# Patient Record
Sex: Female | Born: 1985 | Race: Black or African American | Hispanic: No | Marital: Married
Health system: Southern US, Community
[De-identification: ages and names within clinical notes are randomized; demographics above are authoritative.]

## PROBLEM LIST (undated history)

## (undated) DIAGNOSIS — D649 Anemia, unspecified: Secondary | ICD-10-CM

## (undated) DIAGNOSIS — J45909 Unspecified asthma, uncomplicated: Secondary | ICD-10-CM

## (undated) HISTORY — PX: ABDOMINAL HYSTERECTOMY: SHX81

---
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
## 2006-02-05 ENCOUNTER — Emergency Department (HOSPITAL_COMMUNITY): Admission: EM | Admit: 2006-02-05 | Discharge: 2006-02-06 | Payer: Self-pay | Admitting: Emergency Medicine

## 2006-02-06 ENCOUNTER — Emergency Department (HOSPITAL_COMMUNITY): Admission: EM | Admit: 2006-02-06 | Discharge: 2006-02-06 | Payer: Self-pay | Admitting: *Deleted

## 2018-05-06 ENCOUNTER — Encounter: Payer: Medicaid Other | Admitting: Physician Assistant

## 2018-05-06 ENCOUNTER — Telehealth: Payer: Self-pay | Admitting: Physician Assistant

## 2018-05-06 ENCOUNTER — Encounter: Payer: Self-pay | Admitting: Physician Assistant

## 2018-05-06 MED ORDER — LAMOTRIGINE 25 MG PO TABS: ORAL_TABLET | ORAL | 1 refills | Status: DC

## 2018-05-06 MED ORDER — AMPHETAMINE-DEXTROAMPHET ER 20 MG PO CP24: 20.0000 mg | ORAL_CAPSULE | Freq: Two times a day (BID) | ORAL | 0 refills | Status: DC

## 2018-05-06 NOTE — Progress Notes (Deleted)
[facility]  Patient ID: Angela Stafford,  MRN: [id number]  PCP: Patient, No Pcp Per  Date of Evaluation: 05/06/2018 Time spent:45 minutes  Chief Complaint:  Chief Complaint    Depression; ADD      HISTORY/CURRENT STATUS: HPI   Patient reports loss of interest in usual activities and is unable to enjoy things. Going on for 4 months.  Ran out of her meds from appt in April and didn't come back. Has decreased energy or motivation.  Appetite has not changed.  More extreme sadness, tearfulness, or feelings of hopelessness.  Has changes in concentration since she hasn't been on Adderall, and trouble remembering things.  Denies suicidal or homicidal thoughts. Back in June or July, had a really rough time, couldn't even go in to work b/c depression.  Patient randomly has increased energy but no decreased need for sleep, no increased talkativeness, no racing thoughts, but can be impulsive when she feels really good and will buy a lot for projects or cooking and then may start it but never finishes things.  Pos increased libido when she has the impulsivity.  Also grandiosity during those times.  They can last for a few days or a few weeks and then she crashes.  Then the depression recurs and can last a month or more.   States that attention is much worse since not on the Adderall  Not able to focus on things and finish tasks to completion.   Anxiety is a problem sometimes. Much worse in crowds, like grocery store, and she'll even avoid going to a store till late at night so it won't be so crowded.  Will get panicky with SOB and palpitations for a few minutes when she feels 'cornered.'  Individual Medical History/ Review of Systems: Changes? :No   Past medications for mental health diagnoses include:  Lexapro, Lithium, Adderall, Zoloft   Allergies: Patient has no known allergies.  Current Medications:  Current Outpatient Medications:  .  Fluticasone-Salmeterol (ADVAIR) 500-50  MCG/DOSE AEPB, Inhale 1 puff into the lungs 2 (two) times daily., Disp: , Rfl:  .  gabapentin (NEURONTIN) 300 MG capsule, Take 300 mg by mouth 3 (three) times daily., Disp: , Rfl:  .  meloxicam (MOBIC) 15 MG tablet, Take 15 mg by mouth daily., Disp: , Rfl:  .  montelukast (SINGULAIR) 10 MG tablet, Take 10 mg by mouth at bedtime., Disp: , Rfl:    Medication Side Effects: none  Family Medical/ Social History: Changes? No  MENTAL HEALTH EXAM:  There were no vitals taken for this visit.There is no height or weight on file to calculate BMI.  General Appearance: Well Groomed  Eye Contact:  Good  Speech:  Clear and Coherent  Volume:  Normal  Mood:  Depressed  Affect:  Depressed  Thought Process:  Goal Directed  Orientation:  Full (Time, Place, and Person)  Thought Content: Logical   Suicidal Thoughts:  No  Homicidal Thoughts:  No  Memory:  WNL  Judgement:  Good  Insight:  Good  Psychomotor Activity:  Normal  Concentration:  Concentration: Fair  Recall:  Good  Fund of Knowledge: Good  Language: Good  Assets:  Desire for Improvement  ADL's:  Intact  Cognition: WNL  Prognosis:  Good    DIAGNOSES: No diagnosis found.  Receiving Psychotherapy: No    RECOMMENDATIONS: I spent 45 minutes with patient discussing her dx and tx options.    Melony Overly, PA-C

## 2018-05-08 ENCOUNTER — Encounter: Payer: Self-pay | Admitting: Physician Assistant

## 2018-05-08 NOTE — Progress Notes (Signed)
This encounter was created in error - please disregard.

## 2018-05-13 NOTE — Telephone Encounter (Signed)
No notes.  Encounter was opened in error.

## 2020-06-13 ENCOUNTER — Encounter (HOSPITAL_BASED_OUTPATIENT_CLINIC_OR_DEPARTMENT_OTHER): Payer: Self-pay | Admitting: *Deleted

## 2020-06-13 ENCOUNTER — Other Ambulatory Visit: Payer: Self-pay

## 2020-06-13 DIAGNOSIS — N898 Other specified noninflammatory disorders of vagina: Secondary | ICD-10-CM | POA: Diagnosis not present

## 2020-06-13 DIAGNOSIS — Z202 Contact with and (suspected) exposure to infections with a predominantly sexual mode of transmission: Secondary | ICD-10-CM | POA: Diagnosis not present

## 2020-06-13 DIAGNOSIS — M545 Low back pain, unspecified: Secondary | ICD-10-CM | POA: Diagnosis not present

## 2020-06-13 NOTE — ED Triage Notes (Signed)
C/o vaginal discharge , back pain and lower pelvic pain x 3 days

## 2020-06-14 ENCOUNTER — Emergency Department (HOSPITAL_BASED_OUTPATIENT_CLINIC_OR_DEPARTMENT_OTHER)
Admission: EM | Admit: 2020-06-14 | Discharge: 2020-06-14 | Disposition: A | Discharge status: Home / Self Care | Payer: Medicaid Other | Attending: Emergency Medicine | Admitting: Emergency Medicine

## 2020-06-14 DIAGNOSIS — Z202 Contact with and (suspected) exposure to infections with a predominantly sexual mode of transmission: Secondary | ICD-10-CM

## 2020-06-14 DIAGNOSIS — N898 Other specified noninflammatory disorders of vagina: Secondary | ICD-10-CM

## 2020-06-14 HISTORY — DX: Anemia, unspecified: D64.9

## 2020-06-14 LAB — URINALYSIS, ROUTINE W REFLEX MICROSCOPIC
Bilirubin Urine: NEGATIVE
Glucose, UA: NEGATIVE mg/dL
Hgb urine dipstick: NEGATIVE
Ketones, ur: NEGATIVE mg/dL
Leukocytes,Ua: NEGATIVE
Nitrite: NEGATIVE
Protein, ur: NEGATIVE mg/dL
Specific Gravity, Urine: 1.02 (ref 1.005–1.030)
pH: 6 (ref 5.0–8.0)

## 2020-06-14 MED ORDER — AZITHROMYCIN 250 MG PO TABS
1000.0000 mg | ORAL_TABLET | Freq: Once | ORAL | Status: AC
  Administered 2020-06-14: 1000 mg via ORAL
  Filled 2020-06-14: qty 4

## 2020-06-14 MED ORDER — CEFTRIAXONE SODIUM 500 MG IJ SOLR
500.0000 mg | Freq: Once | INTRAMUSCULAR | Status: AC
  Administered 2020-06-14: 500 mg via INTRAMUSCULAR
  Filled 2020-06-14: qty 500

## 2020-06-14 NOTE — Discharge Instructions (Addendum)
Your were treated for STD due to your concern for potential exposure.  Your urine looks normal.  You have been treated for STD while in the ED.  I recommend abstinence for 7 days and have any of your partners be treated/tested as well.  If your symptoms change or worsen, please return to the ER.

## 2020-06-14 NOTE — ED Provider Notes (Signed)
[facility] EMERGENCY DEPARTMENT Provider Note   CSN: [id number] Arrival date & time: 06/13/20  2350     History Chief Complaint  Patient presents with  . Vaginal Discharge    Angela Stafford is a 34 y.o. female.  Patient presents to the emergency department with a chief complaint of vaginal discharge. She also reports some slight back pain. She states she has been having the symptoms for the past 3 days. She states that she is concerned for STD. She reports that she has a promiscuous partner. She denies any fevers or chills. Denies any vomiting. Denies any dysuria. Hx of hysterectomy.   The history is provided by the patient. No language interpreter was used.       Past Medical History:  Diagnosis Date  . Anemia     There are no problems to display for this patient.   Past Surgical History:  Procedure Laterality Date  . ABDOMINAL HYSTERECTOMY    . CESAREAN SECTION       OB History   No obstetric history on file.     No family history on file.  Social History   Tobacco Use  . Smoking status: Unknown If Ever Smoked  . Smokeless tobacco: Never Used  Substance Use Topics  . Alcohol use: Not Currently    Comment: occasionally  . Drug use: Not Currently    Home Medications Prior to Admission medications   Not on File    Allergies    Patient has no known allergies.  Review of Systems   Review of Systems  All other systems reviewed and are negative.   Physical Exam Updated Vital Signs BP (!) 114/49   Pulse 79   Temp 98.3 F (36.8 C) (Oral)   Resp 16   Ht 5\' 1"  (1.549 m)   Wt 53.5 kg   SpO2 100%   BMI 22.30 kg/m   Physical Exam Vitals and nursing note reviewed.  Constitutional:      General: She is not in acute distress.    Appearance: She is well-developed.  HENT:     Head: Normocephalic and atraumatic.  Eyes:     Conjunctiva/sclera: Conjunctivae normal.  Cardiovascular:     Rate and Rhythm: Normal rate.     Heart sounds:  No murmur heard.   Pulmonary:     Effort: Pulmonary effort is normal. No respiratory distress.  Abdominal:     General: There is no distension.     Comments: No focal abdominal tenderness, no RLQ tenderness or pain at McBurney's point, no RUQ tenderness or Murphy's sign, no left-sided abdominal tenderness, no fluid wave, or signs of peritonitis   Musculoskeletal:     Cervical back: Neck supple.     Comments: Moves all extremities  Skin:    General: Skin is warm and dry.  Neurological:     Mental Status: She is alert and oriented to person, place, and time.  Psychiatric:        Mood and Affect: Mood normal.        Behavior: Behavior normal.     ED Results / Procedures / Treatments   Labs (all labs ordered are listed, but only abnormal results are displayed) Labs Reviewed  URINALYSIS, ROUTINE W REFLEX MICROSCOPIC  PREGNANCY, URINE    EKG None  Radiology No results found.  Procedures Procedures (including critical care time)  Medications Ordered in ED Medications  azithromycin (ZITHROMAX) tablet 1,000 mg (has no administration in time range)  cefTRIAXone (ROCEPHIN)  injection 500 mg (has no administration in time range)    ED Course  I have reviewed the triage vital signs and the nursing notes.  Pertinent labs & imaging results that were available during my care of the patient were reviewed by me and considered in my medical decision making (see chart for details).    MDM Rules/Calculators/A&P                          Patient with vaginal discharge. She has concern for STD. She would like treatment for this. She declines pelvic exam. Her abdomen is soft and nontender. I doubt appendicitis, doubt kidney stone. Urinalysis reassuring. She has history of hysterectomy. Patient given Rocephin and azithromycin for STD. Final Clinical Impression(s) / ED Diagnoses Final diagnoses:  Vaginal discharge  Potential exposure to STD    Rx / DC Orders ED Discharge Orders     None       Roxy Horseman, PA-C 06/14/20 0201    Wilkie Aye Mayer Masker, MD 06/14/20 [phone]

## 2021-06-29 ENCOUNTER — Other Ambulatory Visit: Payer: Self-pay

## 2021-06-29 ENCOUNTER — Emergency Department (HOSPITAL_BASED_OUTPATIENT_CLINIC_OR_DEPARTMENT_OTHER): Payer: 59

## 2021-06-29 ENCOUNTER — Emergency Department (HOSPITAL_BASED_OUTPATIENT_CLINIC_OR_DEPARTMENT_OTHER)
Admission: EM | Admit: 2021-06-29 | Discharge: 2021-06-29 | Disposition: A | Discharge status: Home / Self Care | Payer: 59 | Attending: Emergency Medicine | Admitting: Emergency Medicine

## 2021-06-29 ENCOUNTER — Encounter (HOSPITAL_BASED_OUTPATIENT_CLINIC_OR_DEPARTMENT_OTHER): Payer: Self-pay | Admitting: *Deleted

## 2021-06-29 DIAGNOSIS — R0602 Shortness of breath: Secondary | ICD-10-CM | POA: Diagnosis present

## 2021-06-29 DIAGNOSIS — Z20822 Contact with and (suspected) exposure to covid-19: Secondary | ICD-10-CM | POA: Diagnosis not present

## 2021-06-29 DIAGNOSIS — J45901 Unspecified asthma with (acute) exacerbation: Secondary | ICD-10-CM | POA: Diagnosis not present

## 2021-06-29 HISTORY — DX: Unspecified asthma, uncomplicated: J45.909

## 2021-06-29 LAB — COMPREHENSIVE METABOLIC PANEL
ALT: 14 U/L (ref 0–44)
AST: 18 U/L (ref 15–41)
Albumin: 3.7 g/dL (ref 3.5–5.0)
Alkaline Phosphatase: 43 U/L (ref 38–126)
Anion gap: 9 (ref 5–15)
BUN: 12 mg/dL (ref 6–20)
CO2: 25 mmol/L (ref 22–32)
Calcium: 8.7 mg/dL — ABNORMAL LOW (ref 8.9–10.3)
Chloride: 102 mmol/L (ref 98–111)
Creatinine, Ser: 0.66 mg/dL (ref 0.44–1.00)
GFR, Estimated: >60 mL/min (ref >60)
Glucose, Bld: 101 mg/dL — ABNORMAL HIGH (ref 70–99)
Potassium: 4.2 mmol/L (ref 3.5–5.1)
Sodium: 136 mmol/L (ref 135–145)
Total Bilirubin: 0.4 mg/dL (ref 0.3–1.2)
Total Protein: 6.5 g/dL (ref 6.5–8.1)

## 2021-06-29 LAB — CBC WITH DIFFERENTIAL/PLATELET
Abs Immature Granulocytes: 0.02 10*3/uL (ref 0.00–0.07)
Basophils Absolute: 0.1 10*3/uL (ref 0.0–0.1)
Basophils Relative: 1 %
Eosinophils Absolute: 0.1 10*3/uL (ref 0.0–0.5)
Eosinophils Relative: 1 %
HCT: 36.9 % (ref 36.0–46.0)
Hemoglobin: 12 g/dL (ref 12.0–15.0)
Immature Granulocytes: 0 %
Lymphocytes Relative: 36 %
Lymphs Abs: 2.5 10*3/uL (ref 0.7–4.0)
MCH: 33.1 pg (ref 26.0–34.0)
MCHC: 32.5 g/dL (ref 30.0–36.0)
MCV: 101.7 fL — ABNORMAL HIGH (ref 80.0–100.0)
Monocytes Absolute: 0.6 10*3/uL (ref 0.1–1.0)
Monocytes Relative: 9 %
Neutro Abs: 3.6 10*3/uL (ref 1.7–7.7)
Neutrophils Relative %: 53 %
Platelets: 216 10*3/uL (ref 150–400)
RBC: 3.63 MIL/uL — ABNORMAL LOW (ref 3.87–5.11)
RDW: 12.4 % (ref 11.5–15.5)
WBC: 6.8 10*3/uL (ref 4.0–10.5)
nRBC: 0 % (ref 0.0–0.2)

## 2021-06-29 LAB — RESP PANEL BY RT-PCR (FLU A&B, COVID) ARPGX2
Influenza A by PCR: NEGATIVE
Influenza B by PCR: NEGATIVE
SARS Coronavirus 2 by RT PCR: NEGATIVE

## 2021-06-29 LAB — TROPONIN I (HIGH SENSITIVITY)
Troponin I (High Sensitivity): 2 ng/L (ref <18)
Troponin I (High Sensitivity): 2 ng/L (ref <18)

## 2021-06-29 MED ORDER — IOHEXOL 350 MG/ML SOLN
100.0000 mL | Freq: Once | INTRAVENOUS | Status: AC | PRN
  Administered 2021-06-29: 100 mL via INTRAVENOUS

## 2021-06-29 MED ORDER — ALBUTEROL SULFATE HFA 108 (90 BASE) MCG/ACT IN AERS
2.0000 | INHALATION_SPRAY | Freq: Once | RESPIRATORY_TRACT | Status: AC
  Administered 2021-06-29: 2 via RESPIRATORY_TRACT
  Filled 2021-06-29: qty 6.7

## 2021-06-29 MED ORDER — KETOROLAC TROMETHAMINE 15 MG/ML IJ SOLN
15.0000 mg | Freq: Once | INTRAMUSCULAR | Status: AC
  Administered 2021-06-29: 15 mg via INTRAVENOUS
  Filled 2021-06-29: qty 1

## 2021-06-29 MED ORDER — FENTANYL CITRATE PF 50 MCG/ML IJ SOSY
50.0000 ug | PREFILLED_SYRINGE | Freq: Once | INTRAMUSCULAR | Status: AC
  Administered 2021-06-29: 50 ug via INTRAVENOUS
  Filled 2021-06-29: qty 1

## 2021-06-29 MED ORDER — FENTANYL CITRATE PF 50 MCG/ML IJ SOSY
50.0000 ug | PREFILLED_SYRINGE | Freq: Once | INTRAMUSCULAR | Status: AC
Start: 2021-06-29 — End: 2021-06-29
  Administered 2021-06-29: 50 ug via INTRAVENOUS
  Filled 2021-06-29: qty 1

## 2021-06-29 NOTE — ED Triage Notes (Signed)
Hx of asthma. Sob. She ran out of her inhaler. Chest and back pain with movement. EKG at triage.

## 2021-06-29 NOTE — ED Notes (Signed)
Patient transported to CT 

## 2021-06-29 NOTE — ED Provider Notes (Signed)
[facility] EMERGENCY DEPARTMENT Provider Note   CSN: [id number] Arrival date & time: 06/29/21  1632     History { Chief Complaint  Patient presents with   Shortness of Breath    Angela Stafford is a 36 y.o. female.  36 year old female with a past medical history of asthma presents to the ED with a chief complaint of shortness of breath.  Patient is currently on albuterol inhaler, reports she took her last puff approximately an hour ago prior to arrival in the ED.  She reports he is currently out of medication, this will not be available to her until tomorrow.  In addition, she is endorsing some pain along her chest, exacerbated with inspiration that radiates to her back.  She does have a longstanding history of deep smoking, marijuana use.  Also endorses a cough, with some clear sputum.  She denies any fever, leg swelling, other complaints.  No sick contacts.  The history is provided by the patient and medical records.  Shortness of Breath Severity:  Moderate Onset quality:  Gradual Duration:  1 hour Timing:  Constant Progression:  Unchanged Chronicity:  New Associated symptoms: cough   Associated symptoms: no abdominal pain, no chest pain, no fever, no sore throat and no vomiting       Home Medications Prior to Admission medications   Medication Sig Start Date End Date Taking? Authorizing Provider  albuterol (VENTOLIN HFA) 108 (90 Base) MCG/ACT inhaler Inhale into the lungs. 02/02/15  Yes [provider]      Allergies    Patient has no known allergies.    Review of Systems   Review of Systems  Constitutional:  Negative for chills and fever.  HENT:  Negative for sore throat.   Respiratory:  Positive for cough and shortness of breath.   Cardiovascular:  Negative for chest pain.  Gastrointestinal:  Negative for abdominal pain, nausea and vomiting.  Genitourinary:  Negative for flank pain.   Physical Exam Updated Vital Signs BP (!) 161/93    Pulse  73    Temp 98.6 F (37 C) (Oral)    Resp 16    Ht 5\' 1"  (1.549 m)    Wt 53.1 kg    SpO2 100%    BMI 22.11 kg/m  Physical Exam Vitals and nursing note reviewed.  Constitutional:      General: She is not in acute distress.    Appearance: She is well-developed.  HENT:     Head: Normocephalic and atraumatic.     Mouth/Throat:     Pharynx: No oropharyngeal exudate.  Eyes:     Pupils: Pupils are equal, round, and reactive to light.  Cardiovascular:     Rate and Rhythm: Regular rhythm.     Heart sounds: Normal heart sounds.  Pulmonary:     Effort: Pulmonary effort is normal. No respiratory distress.     Breath sounds: Normal breath sounds.  Abdominal:     General: Bowel sounds are normal. There is no distension.     Palpations: Abdomen is soft.     Tenderness: There is no abdominal tenderness.  Musculoskeletal:        General: No tenderness or deformity.     Cervical back: Normal range of motion.     Right lower leg: No tenderness. No edema.     Left lower leg: No tenderness. No edema.  Skin:    General: Skin is warm and dry.  Neurological:     Mental Status: She is  alert and oriented to person, place, and time.    ED Results / Procedures / Treatments   Labs (all labs ordered are listed, but only abnormal results are displayed) Labs Reviewed  CBC WITH DIFFERENTIAL/PLATELET - Abnormal; Notable for the following components:      Result Value   RBC 3.63 (*)    MCV 101.7 (*)    All other components within normal limits  COMPREHENSIVE METABOLIC PANEL - Abnormal; Notable for the following components:   Glucose, Bld 101 (*)    Calcium 8.7 (*)    All other components within normal limits  RESP PANEL BY RT-PCR (FLU A&B, COVID) ARPGX2  TROPONIN I (HIGH SENSITIVITY)  TROPONIN I (HIGH SENSITIVITY)    EKG EKG Interpretation  Date/Time:  Thursday June 29 2021 16:42:30 EST Ventricular Rate:  70 PR Interval:  222 QRS Duration: 88 QT Interval:  392 QTC Calculation: 423 R  Axis:   89 Text Interpretation: Sinus rhythm with 1st degree A-V block Biatrial enlargement Anterior infarct , age undetermined Abnormal ECG No significant change since last tracing Confirmed by Susy Frizzle [id number]) on 06/29/2021 4:46:11 PM  Radiology DG Chest 2 View  Result Date: 06/29/2021 CLINICAL DATA:  Hx of asthma. Sob. She ran out of her inhaler. Chest and back pain with movement. Pt states she can not take out piercings. EXAM: CHEST - 2 VIEW COMPARISON:  06/21/2018 FINDINGS: Lungs are clear. Heart size and mediastinal contours are within normal limits. No effusion.  No pneumothorax. Visualized bones unremarkable. IMPRESSION: No acute cardiopulmonary disease. Electronically Signed   By: Corlis Leak M.D.   On: 06/29/2021 19:05   CT Angio Chest Aorta W and/or Wo Contrast  Result Date: 06/29/2021 CLINICAL DATA:  Chest pain and shortness of breath. EXAM: CT ANGIOGRAPHY CHEST WITH CONTRAST TECHNIQUE: Multidetector CT imaging of the chest was performed using the standard protocol during bolus administration of intravenous contrast. Multiplanar CT image reconstructions and MIPs were obtained to evaluate the vascular anatomy. CONTRAST:  OMNIPAQUE IOHEXOL 350 MG/ML SOLN COMPARISON:  None. FINDINGS: Cardiovascular: Satisfactory opacification of the pulmonary arteries to the segmental level. No evidence of pulmonary embolism. Normal heart size. No pericardial effusion. Mediastinum/Nodes: No enlarged mediastinal, hilar, or axillary lymph nodes. Thyroid gland, trachea, and esophagus demonstrate no significant findings. Lungs/Pleura: Lungs are clear. No pleural effusion or pneumothorax. Upper Abdomen: No acute abnormality. Musculoskeletal: No chest wall abnormality. No acute or significant osseous findings. Review of the MIP images confirms the above findings. IMPRESSION: Negative examination for pulmonary embolism or acute cardiopulmonary disease. Electronically Signed   By: Aram Candela M.D.   On:  06/29/2021 22:12    Procedures Procedures    Medications Ordered in ED Medications  albuterol (VENTOLIN HFA) 108 (90 Base) MCG/ACT inhaler 2 puff (2 puffs Inhalation Given 06/29/21 1850)  fentaNYL (SUBLIMAZE) injection 50 mcg (50 mcg Intravenous Given 06/29/21 2027)  fentaNYL (SUBLIMAZE) injection 50 mcg (50 mcg Intravenous Given 06/29/21 2201)  iohexol (OMNIPAQUE) 350 MG/ML injection 100 mL (100 mLs Intravenous Contrast Given 06/29/21 2151)  ketorolac (TORADOL) 15 MG/ML injection 15 mg (15 mg Intravenous Given 06/29/21 2256)    ED Course/ Medical Decision Making/ A&P                           Medical Decision Making   Patient presents to the ED with a chief complaint of shortness of breath that occurred after running out of her inhaler prior to arrival in the  ED.  Also endorsing some pain along her chest with radiation to her back.  This happened suddenly.  She does have a history of asthma, lungs a history of a smoking, however this test she discontinued this approximately 4 to 5 days ago, but significant other at the bedside reports that she last used today 45 minutes prior to arrival.  During arrival patient's vitals are within normal limits, no hypoxia, no tachycardia.  She does have prior history of high blood pressure which she takes medication for with compliance.  Her lungs are slightly diminished to auscultation, she is unable to take a deep breath due to pain that is ongoing.  There is pain palpation of her chest wall reproducible on exam.  No signs of volume overload, no pitting edema.  No calf tenderness.  We discussed chest x-ray to rule out any pneumonia.  Provided with albuterol inhaler while in the ED.  X-ray is without any acute findings.  Vitals are within normal limits.  I have a lower suspicion for ACS, with no prior history or family history.  Second troponin is within normal limits without any increase.  Respiratory panel has been sent however it has not returned.  CT angio  chest without any pulmonary embolism, no cardiac etiology found.  Patient has received 2 rounds of fentanyl at this time today.  Her x-ray Second troponin has also remained flat.  I discussed all these results with patient at length.  She is negative for COVID-19, influenza, influenza B.  Patient is advised to follow-up with primary care physician, along with cardiology. Stable for discharge.  Portions of this note were generated with Scientist, clinical (histocompatibility and immunogenetics). Dictation errors may occur despite best attempts at proofreading.  Final Clinical Impression(s) / ED Diagnoses Final diagnoses:  Mild asthma with exacerbation, unspecified whether persistent    Rx / DC Orders ED Discharge Orders     None         Claude Manges, PA-C 06/29/21 2312    Pollyann Savoy, MD 07/04/21 [phone]

## 2021-06-29 NOTE — ED Notes (Signed)
Patient returned from CT

## 2021-06-29 NOTE — Discharge Instructions (Addendum)
Your x-ray on today's visit was within normal limits.  You were provided with an inhaler while in the ED.  If you experience any worsening symptoms, worsening pain you will need to return to the emergency department.

## 2023-02-09 IMAGING — CT CT ANGIO CHEST
3 of 10 series · 19 of 46 positions shown · IV contrast (Omnipaque)
Comparison: None.

CLINICAL DATA: Chest pain and shortness of breath.

EXAM:
CT ANGIOGRAPHY CHEST WITH CONTRAST
TECHNIQUE: Multidetector CT imaging of the chest was performed using the
standard protocol during bolus administration of intravenous
contrast. Multiplanar CT image reconstructions and MIPs were
obtained to evaluate the vascular anatomy.
CONTRAST:  100mL OMNIPAQUE IOHEXOL 350 MG/ML SOLN

[Series 6: axial arterial · axial · arterial · 0.64mm/px · z∈[-218,+10]mm · 12 of 90 slices shown]
[im 7/90  lung]
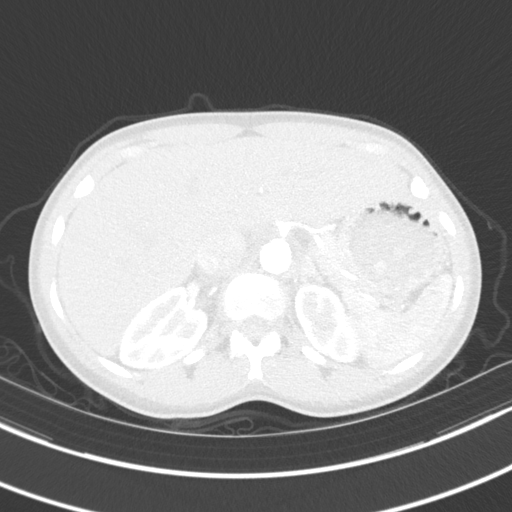
[im 14/90  soft-tissue]
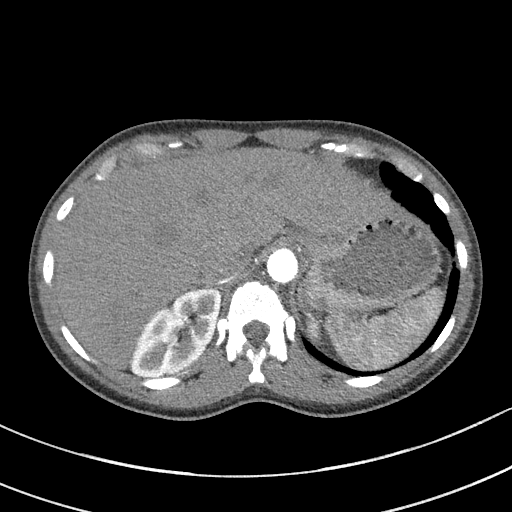
[im 21/90  lung]
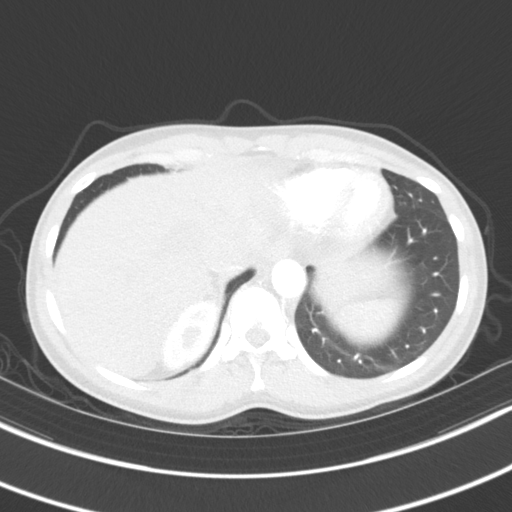
[im 28/90  soft-tissue]
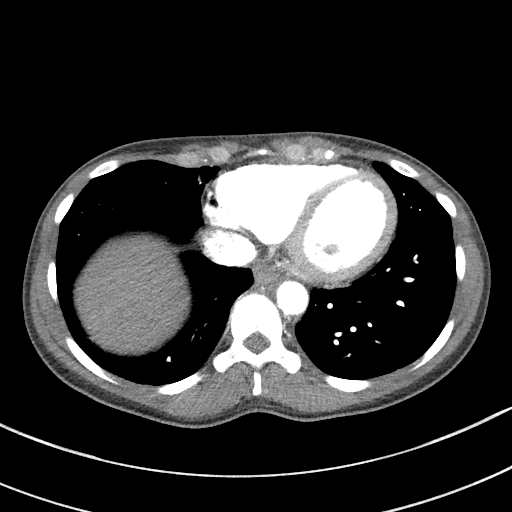
[im 35/90  lung]
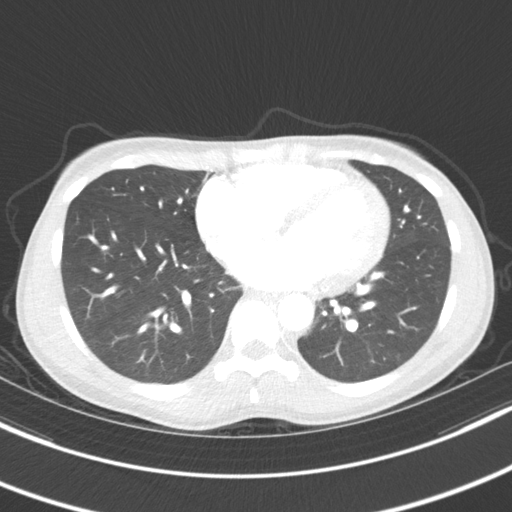
[im 42/90  soft-tissue]
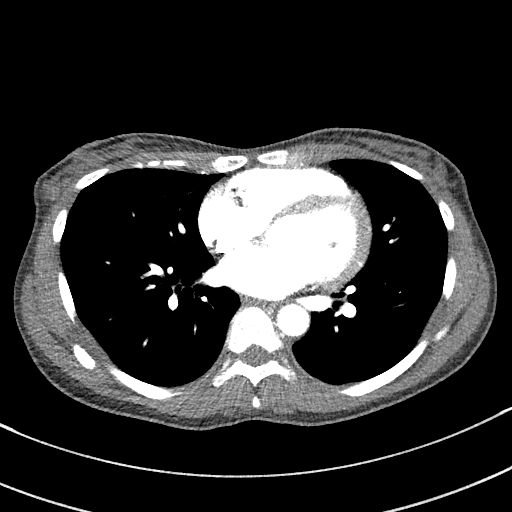
[im 48/90  lung]
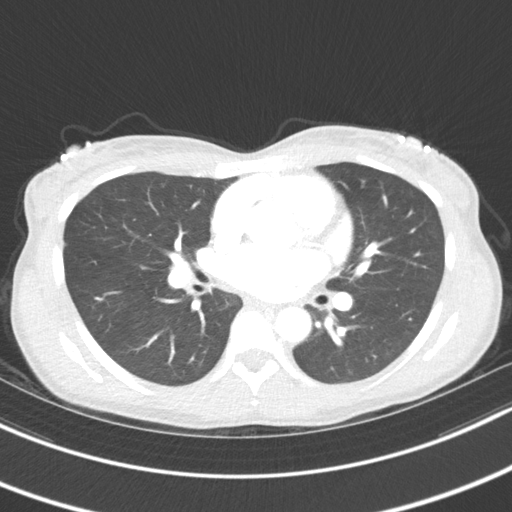
[im 55/90  soft-tissue]
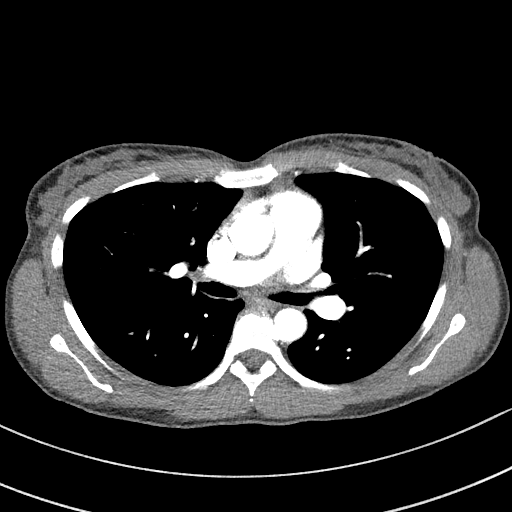
[im 62/90  lung]
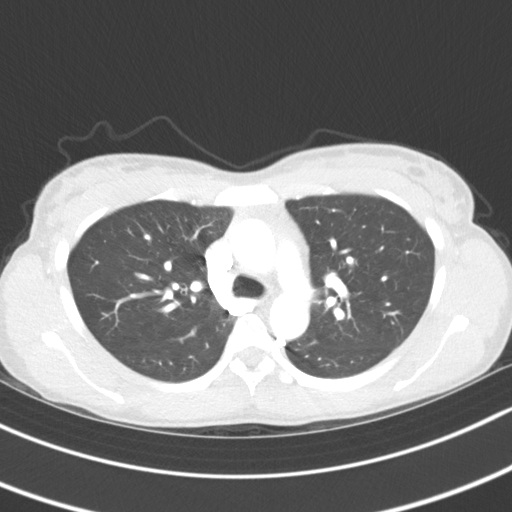
[im 69/90  soft-tissue]
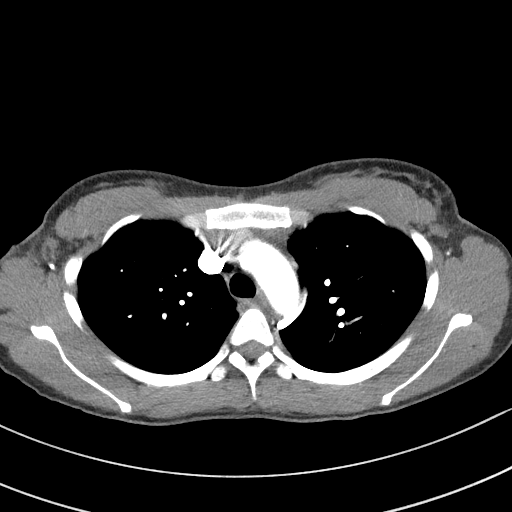
[im 76/90  lung]
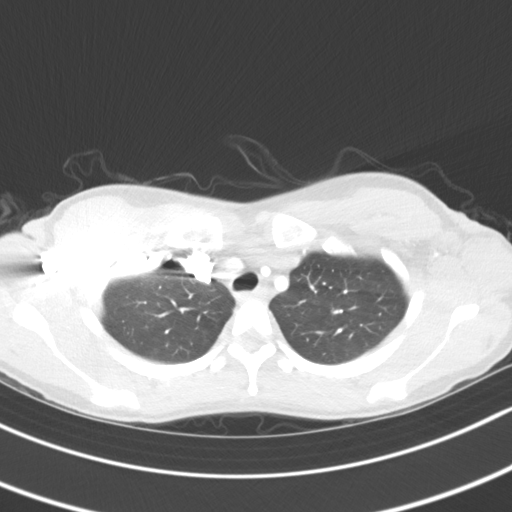
[im 83/90  soft-tissue]
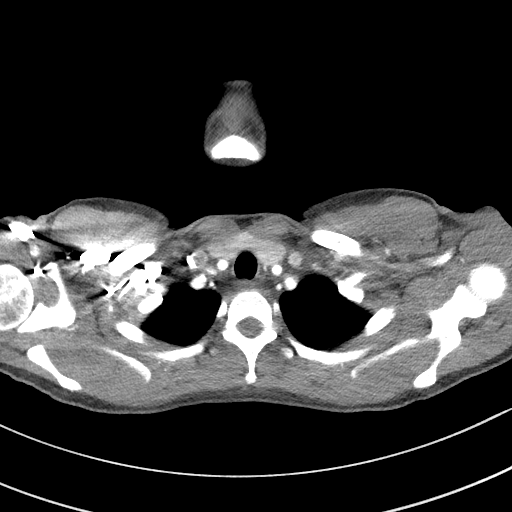

[Series 7: lung · axial · 0.64mm/px · z∈[-199,-84]mm · 4 of 54 slices shown]
[im 8/54  soft-tissue]
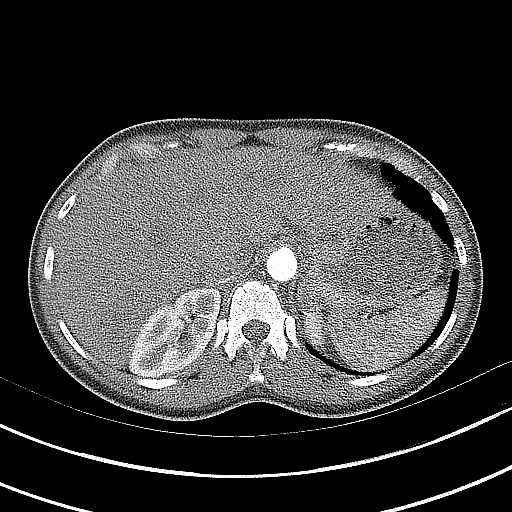
[im 16/54  soft-tissue]
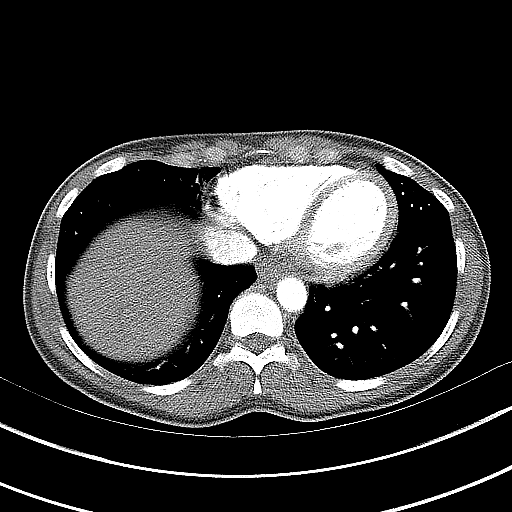
[im 23/54  soft-tissue]
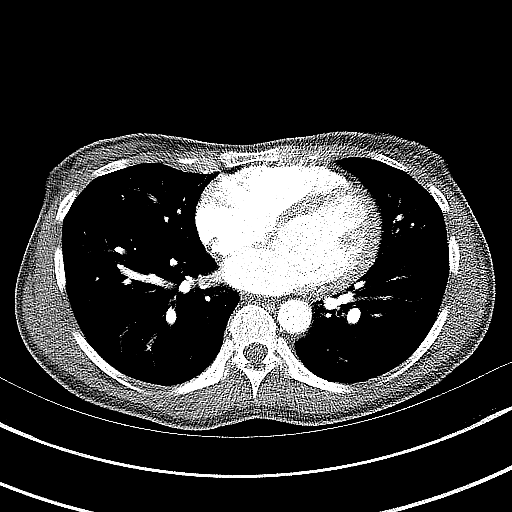
[im 31/54  soft-tissue]
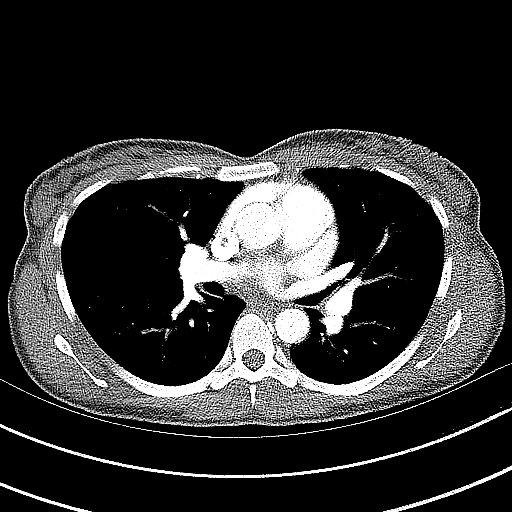

[Series 9: coronals · coronal · 0.56mm/px · 3 of 101 slices shown]
[im 26/101  soft-tissue]
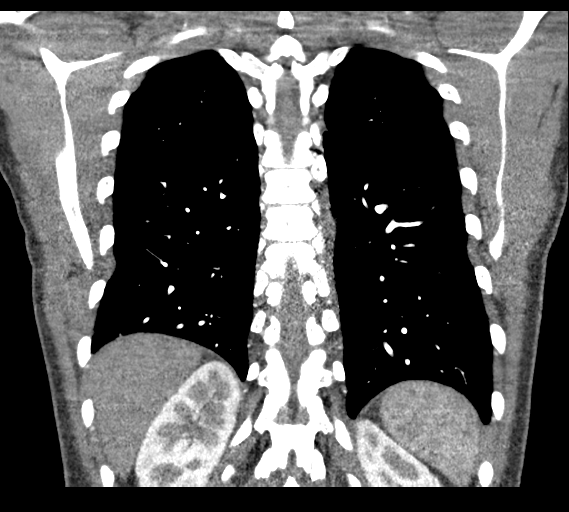
[im 51/101  soft-tissue]
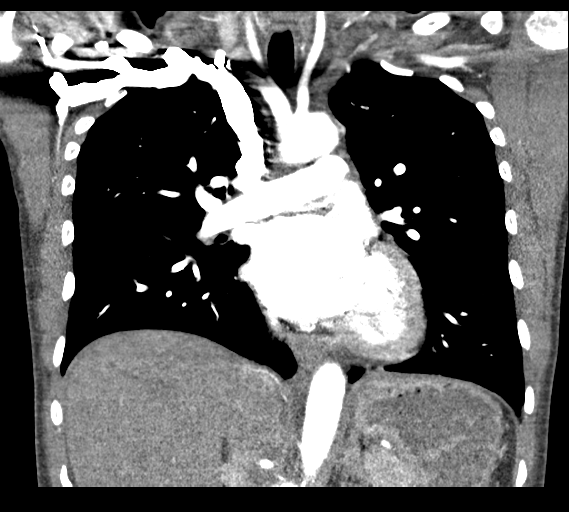
[im 76/101  soft-tissue]
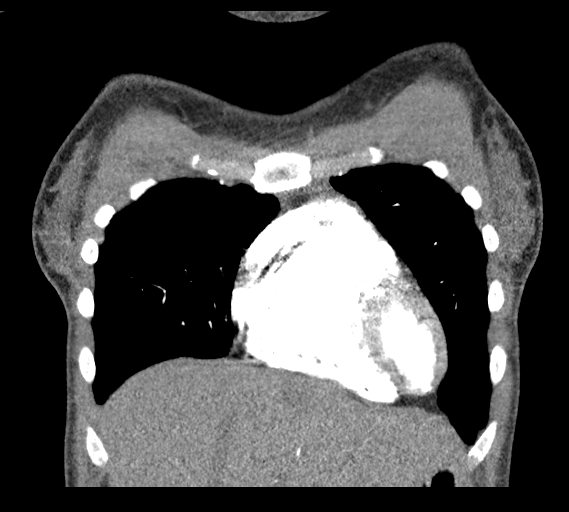

[19 of 46 positions shown; findings below may reference images not displayed]

FINDINGS: Cardiovascular: Satisfactory opacification of the pulmonary arteries
to the segmental level. No evidence of pulmonary embolism. Normal
heart size. No pericardial effusion.

Mediastinum/Nodes: No enlarged mediastinal, hilar, or axillary lymph
nodes. Thyroid gland, trachea, and esophagus demonstrate no
significant findings.

Lungs/Pleura: Lungs are clear. No pleural effusion or pneumothorax.

Upper Abdomen: No acute abnormality.

Musculoskeletal: No chest wall abnormality. No acute or significant
osseous findings.

Review of the MIP images confirms the above findings.
IMPRESSION: Negative examination for pulmonary embolism or acute cardiopulmonary
disease.

## 2023-12-11 ENCOUNTER — Emergency Department (HOSPITAL_BASED_OUTPATIENT_CLINIC_OR_DEPARTMENT_OTHER): Payer: MEDICAID

## 2023-12-11 ENCOUNTER — Other Ambulatory Visit: Payer: Self-pay

## 2023-12-11 ENCOUNTER — Emergency Department (HOSPITAL_BASED_OUTPATIENT_CLINIC_OR_DEPARTMENT_OTHER)
Admission: EM | Admit: 2023-12-11 | Discharge: 2023-12-12 | Disposition: A | Discharge status: Home / Self Care | Payer: MEDICAID | Attending: Emergency Medicine | Admitting: Emergency Medicine

## 2023-12-11 ENCOUNTER — Encounter (HOSPITAL_BASED_OUTPATIENT_CLINIC_OR_DEPARTMENT_OTHER): Payer: Self-pay | Admitting: Emergency Medicine

## 2023-12-11 DIAGNOSIS — R079 Chest pain, unspecified: Secondary | ICD-10-CM | POA: Diagnosis present

## 2023-12-11 DIAGNOSIS — R1013 Epigastric pain: Secondary | ICD-10-CM | POA: Insufficient documentation

## 2023-12-11 DIAGNOSIS — I10 Essential (primary) hypertension: Secondary | ICD-10-CM | POA: Diagnosis not present

## 2023-12-11 LAB — BASIC METABOLIC PANEL WITH GFR
Anion gap: 12 (ref 5–15)
BUN: 10 mg/dL (ref 6–20)
CO2: 25 mmol/L (ref 22–32)
Calcium: 9.6 mg/dL (ref 8.9–10.3)
Chloride: 100 mmol/L (ref 98–111)
Creatinine, Ser: 0.77 mg/dL (ref 0.44–1.00)
GFR, Estimated: >60 mL/min (ref >60)
Glucose, Bld: 94 mg/dL (ref 70–99)
Potassium: 3.4 mmol/L — ABNORMAL LOW (ref 3.5–5.1)
Sodium: 137 mmol/L (ref 135–145)

## 2023-12-11 LAB — CBC
HCT: 38.4 % (ref 36.0–46.0)
Hemoglobin: 12.8 g/dL (ref 12.0–15.0)
MCH: 32.5 pg (ref 26.0–34.0)
MCHC: 33.3 g/dL (ref 30.0–36.0)
MCV: 97.5 fL (ref 80.0–100.0)
Platelets: 253 10*3/uL (ref 150–400)
RBC: 3.94 MIL/uL (ref 3.87–5.11)
RDW: 11.9 % (ref 11.5–15.5)
WBC: 7 10*3/uL (ref 4.0–10.5)
nRBC: 0 % (ref 0.0–0.2)

## 2023-12-11 LAB — TROPONIN T, HIGH SENSITIVITY: Troponin T High Sensitivity: <15 ng/L (ref <19)

## 2023-12-11 MED ORDER — ALUM & MAG HYDROXIDE-SIMETH 200-200-20 MG/5ML PO SUSP
30.0000 mL | Freq: Once | ORAL | Status: AC
  Administered 2023-12-12: 30 mL via ORAL
  Filled 2023-12-11: qty 30

## 2023-12-11 NOTE — ED Triage Notes (Signed)
 Pt POV c/o L chest pain x3 weeks radiating to back, worse when lying down. Pt reports ShOB but speaking in full sentences, equal and unlabored. Pt appears very anxious in triage.

## 2023-12-12 LAB — LIPASE, BLOOD: Lipase: 47 U/L (ref 11–51)

## 2023-12-12 LAB — HEPATIC FUNCTION PANEL
ALT: 5 U/L (ref 0–44)
AST: 14 U/L — ABNORMAL LOW (ref 15–41)
Albumin: 4.2 g/dL (ref 3.5–5.0)
Alkaline Phosphatase: 47 U/L (ref 38–126)
Bilirubin, Direct: 0.2 mg/dL (ref 0.0–0.2)
Indirect Bilirubin: 0.2 mg/dL — ABNORMAL LOW (ref 0.3–0.9)
Total Bilirubin: 0.5 mg/dL (ref 0.0–1.2)
Total Protein: 6.9 g/dL (ref 6.5–8.1)

## 2023-12-12 LAB — TROPONIN T, HIGH SENSITIVITY: Troponin T High Sensitivity: <15 ng/L (ref <19)

## 2023-12-12 MED ORDER — AMLODIPINE BESYLATE 5 MG PO TABS
5.0000 mg | ORAL_TABLET | Freq: Once | ORAL | Status: AC
  Administered 2023-12-12: 5 mg via ORAL
  Filled 2023-12-12: qty 1

## 2023-12-12 MED ORDER — AMLODIPINE BESYLATE 5 MG PO TABS: 5.0000 mg | ORAL_TABLET | Freq: Every day | ORAL | 3 refills | Status: AC

## 2023-12-12 NOTE — ED Provider Notes (Signed)
 [place] [facility] Provider Note   CSN: [id number] Arrival date & time: 12/11/23  2148     Patient presents with: Chest Pain   Angela Stafford is a 38 y.o. female.   38 yo F with a chief complaint of chest pain.  This is sharp seems to come and go worse at night.  Radiates under her left chest and then radiates to the back at times.  Started with what she felt was food poisoning.  Had an episode of significant nausea and vomiting.  Since then just has not gotten any better.  She was discussing it with a coworker and they were worried that perhaps this was due to her gallbladder she decided to come here for evaluation.   Chest Pain      Prior to Admission medications   Medication Sig Start Date End Date Taking? Authorizing Provider  amLODipine (NORVASC) 5 MG tablet Take 1 tablet (5 mg total) by mouth daily. 12/12/23  Yes Albertus Hughs, DO  albuterol  (VENTOLIN  HFA) [phone] Base) MCG/ACT inhaler Inhale into the lungs. 02/02/15   [provider]    Allergies: Patient has no known allergies.    Review of Systems  Cardiovascular:  Positive for chest pain.    Updated Vital Signs BP (!) 172/110   Pulse 68   Temp 98.1 F (36.7 C) (Oral)   Resp 13   Ht 5' 1 (1.549 m)   Wt 62.6 kg   SpO2 100%   BMI 26.07 kg/m   Physical Exam Vitals and nursing note reviewed.  Constitutional:      General: She is not in acute distress.    Appearance: She is well-developed. She is not diaphoretic.  HENT:     Head: Normocephalic and atraumatic.   Eyes:     Pupils: Pupils are equal, round, and reactive to light.    Cardiovascular:     Rate and Rhythm: Normal rate and regular rhythm.     Heart sounds: No murmur heard.    No friction rub. No gallop.  Pulmonary:     Effort: Pulmonary effort is normal.     Breath sounds: No wheezing or rales.  Abdominal:     General: There is no distension.     Palpations: Abdomen is soft.     Tenderness:  There is abdominal tenderness (epigastric, no obvious pain in the right upper quadrant.  Negative Murphy sign.).   Musculoskeletal:        General: No tenderness.     Cervical back: Normal range of motion and neck supple.   Skin:    General: Skin is warm and dry.   Neurological:     Mental Status: She is alert and oriented to person, place, and time.   Psychiatric:        Behavior: Behavior normal.     (all labs ordered are listed, but only abnormal results are displayed) Labs Reviewed  BASIC METABOLIC PANEL WITH GFR - Abnormal; Notable for the following components:      Result Value   Potassium 3.4 (*)    All other components within normal limits  HEPATIC FUNCTION PANEL - Abnormal; Notable for the following components:   AST 14 (*)    Indirect Bilirubin 0.2 (*)    All other components within normal limits  CBC  LIPASE, BLOOD  TROPONIN T, HIGH SENSITIVITY  TROPONIN T, HIGH SENSITIVITY    EKG: None  Radiology: DG Chest 2 View Result Date:  12/11/2023 EXAM: 2 VIEW(S) XRAY OF THE CHEST 12/11/2023 10:09:39 PM COMPARISON: 06/29/2021 CLINICAL HISTORY: Chest pain. 38 y/o female c/o L chest pain x3 weeks radiating to back, worse when lying down. Pt reports ShOB. FINDINGS: LUNGS AND PLEURA: No focal pulmonary opacity. No pulmonary edema. No pleural effusion. No pneumothorax. HEART AND MEDIASTINUM: No acute abnormality of the cardiac and mediastinal silhouettes. BONES AND SOFT TISSUES: No acute osseous abnormality. IMPRESSION: 1. No acute process. Electronically signed by: Zadie Herter MD 12/11/2023 10:18 PM EDT RP Workstation: [id number]     Procedures   Medications Ordered in the ED  alum & mag hydroxide-simeth (MAALOX/MYLANTA) 200-200-20 MG/5ML suspension 30 mL (30 mLs Oral Given 12/12/23 0003)  amLODipine (NORVASC) tablet 5 mg (5 mg Oral Given 12/12/23 0025)                                    Medical Decision Making Amount and/or Complexity of Data Reviewed Labs:  ordered. Radiology: ordered.  Risk OTC drugs. Prescription drug management.   38 yo F with a chief complaints of epigastric discomfort.  Has been going on for about 3 weeks.  Started shortly after having a illness where she had significant amounts of nausea and vomiting.   By history it sounds likely the patient is experienced reflux.  Ongoing symptoms seem to be worse when she lays back flat as well.  Her troponin here is negative.  No leukocytosis no anemia.  Chest x-ray independently interpreted by me without focal infiltrate or pneumothorax.  With pain that radiates to the back we will obtain LFTs and lipase.  GI cocktail.  Reassess.  Patient continues to have epigastric discomfort.  LFTs and lipase have resulted and are unremarkable.  Will discharge home.  PCP follow-up.  12:50 AM:  I have discussed the diagnosis/risks/treatment options with the patient.  Evaluation and diagnostic testing in the emergency department does not suggest an emergent condition requiring admission or immediate intervention beyond what has been performed at this time.  They will follow up with PCP. We also discussed returning to the ED immediately if new or worsening sx occur. We discussed the sx which are most concerning (e.g., sudden worsening pain, fever, inability to tolerate by mouth) that necessitate immediate return. Medications administered to the patient during their visit and any new prescriptions provided to the patient are listed below.  Medications given during this visit Medications  alum & mag hydroxide-simeth (MAALOX/MYLANTA) 200-200-20 MG/5ML suspension 30 mL (30 mLs Oral Given 12/12/23 0003)  amLODipine (NORVASC) tablet 5 mg (5 mg Oral Given 12/12/23 0025)     The patient appears reasonably screen and/or stabilized for discharge and I doubt any other medical condition or other [facility] requiring further screening, evaluation, or treatment in the ED at this time prior to discharge.      Final  diagnoses:  Nonspecific chest pain  Uncontrolled hypertension    ED Discharge Orders          Ordered    amLODipine (NORVASC) 5 MG tablet  Daily        12/12/23 0013               Albertus Hughs, DO 12/12/23 0050

## 2023-12-12 NOTE — Discharge Instructions (Addendum)
 Try pepcid or tagamet up to twice a day.  Try to avoid things that may make this worse, most commonly these are spicy foods tomato based products fatty foods chocolate and peppermint.  Alcohol and tobacco can also make this worse.  Return to the emergency department for sudden worsening pain fever or inability to eat or drink.  Your blood pressure was high here.  Please follow-up with your family doctor in the office.  I did start you on a medication for this.
